# Patient Record
Sex: Female | Born: 1960 | Race: White | Hispanic: No | Marital: Married | Smoking: Current every day smoker
Health system: Southern US, Community
[De-identification: ages and names within clinical notes are randomized; demographics above are authoritative.]

## PROBLEM LIST (undated history)

## (undated) HISTORY — PX: TONSILLECTOMY: SUR1361

## (undated) HISTORY — PX: OTHER SURGICAL HISTORY: SHX169

## (undated) HISTORY — PX: CHOLECYSTECTOMY: SHX55

---

## 2007-01-18 ENCOUNTER — Ambulatory Visit: Payer: Self-pay | Admitting: Internal Medicine

## 2007-01-19 ENCOUNTER — Ambulatory Visit: Payer: Self-pay | Admitting: Internal Medicine

## 2007-05-14 ENCOUNTER — Ambulatory Visit: Payer: Self-pay | Admitting: Emergency Medicine

## 2007-05-22 ENCOUNTER — Ambulatory Visit: Payer: Self-pay | Admitting: Emergency Medicine

## 2007-12-04 ENCOUNTER — Ambulatory Visit: Payer: Self-pay | Admitting: Family Medicine

## 2008-06-28 ENCOUNTER — Ambulatory Visit: Payer: Self-pay | Admitting: Family Medicine

## 2008-08-28 IMAGING — CR DG CHEST 2V
1 series · 2 of 2 positions shown · non-contrast
Comparison: none

REASON FOR EXAM: sob
COMMENTS:

PROCEDURE:     MDR - MDR CHEST PA(OR AP) AND LATERAL  - May 22, 2007 [DATE]
RESULT:      The lung fields are clear. The heart, mediastinal and osseous
structures show no acute changes.

[Series 1: view not recorded · 0.17mm/px · 2 of 2 slices shown]
[im 1/2]
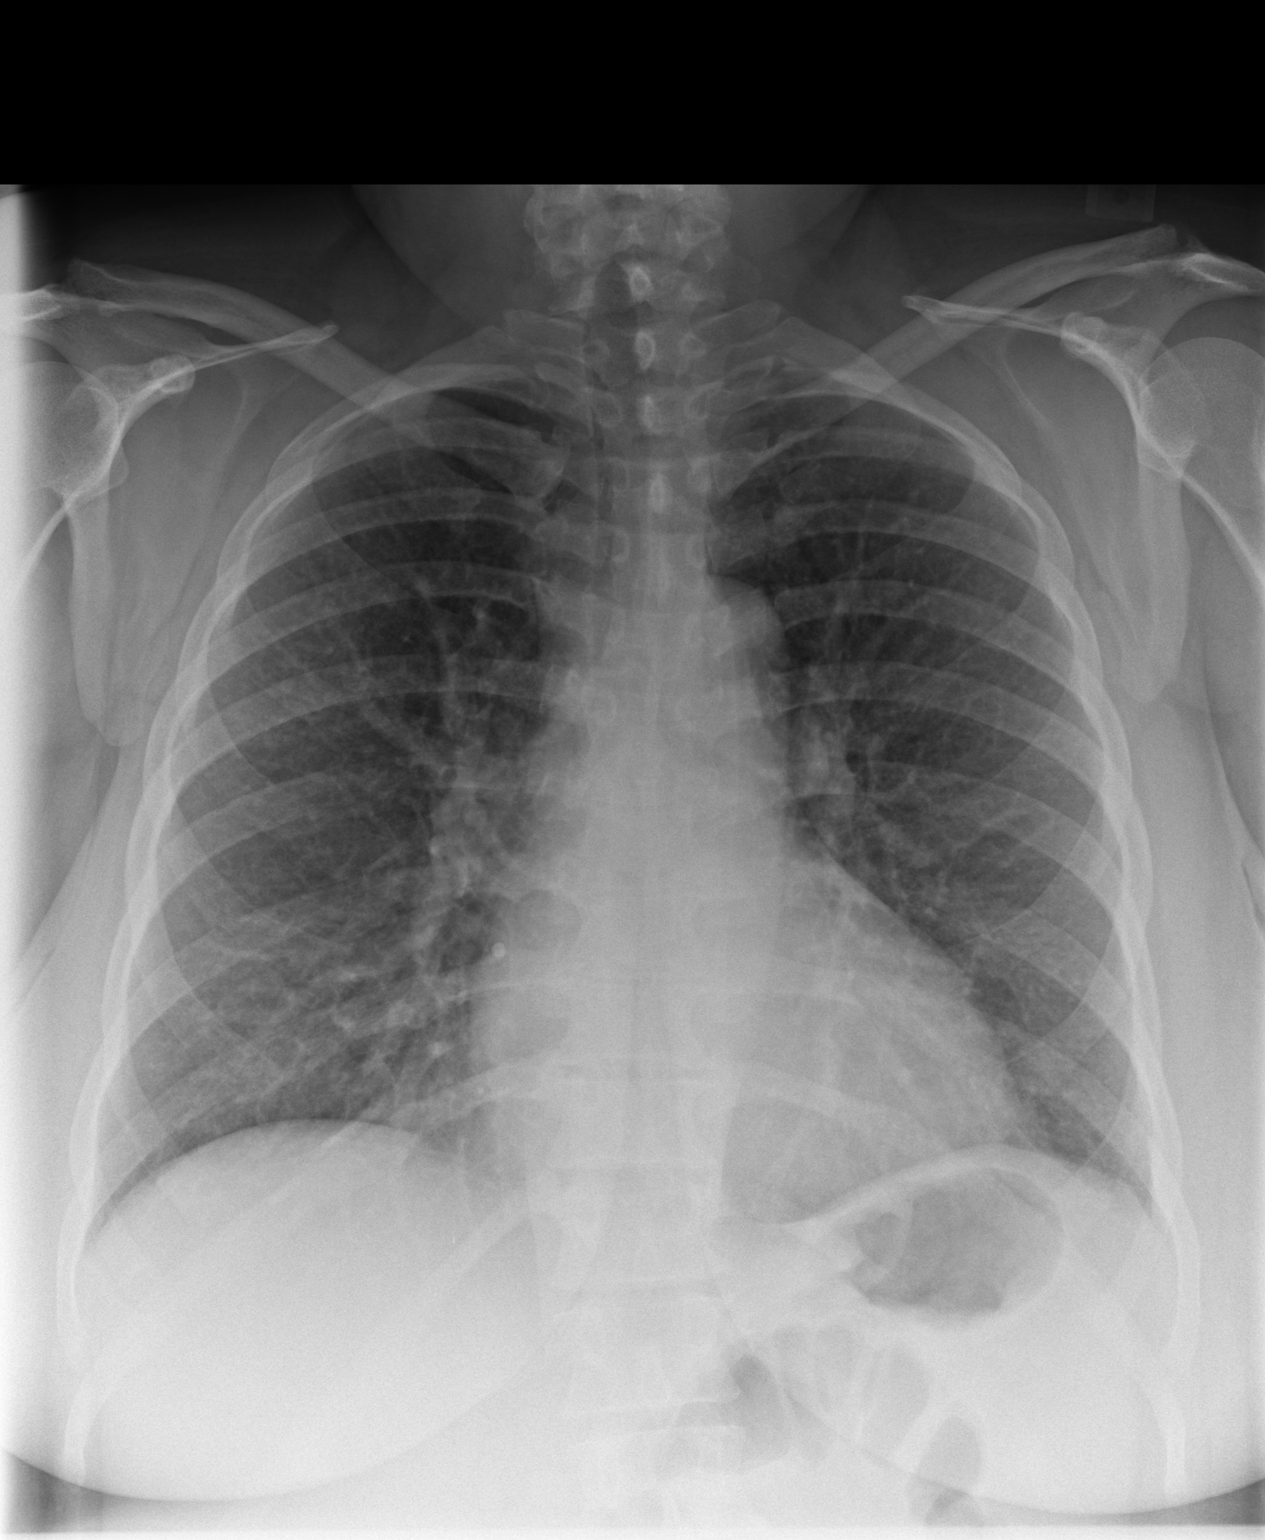
[im 2/2]
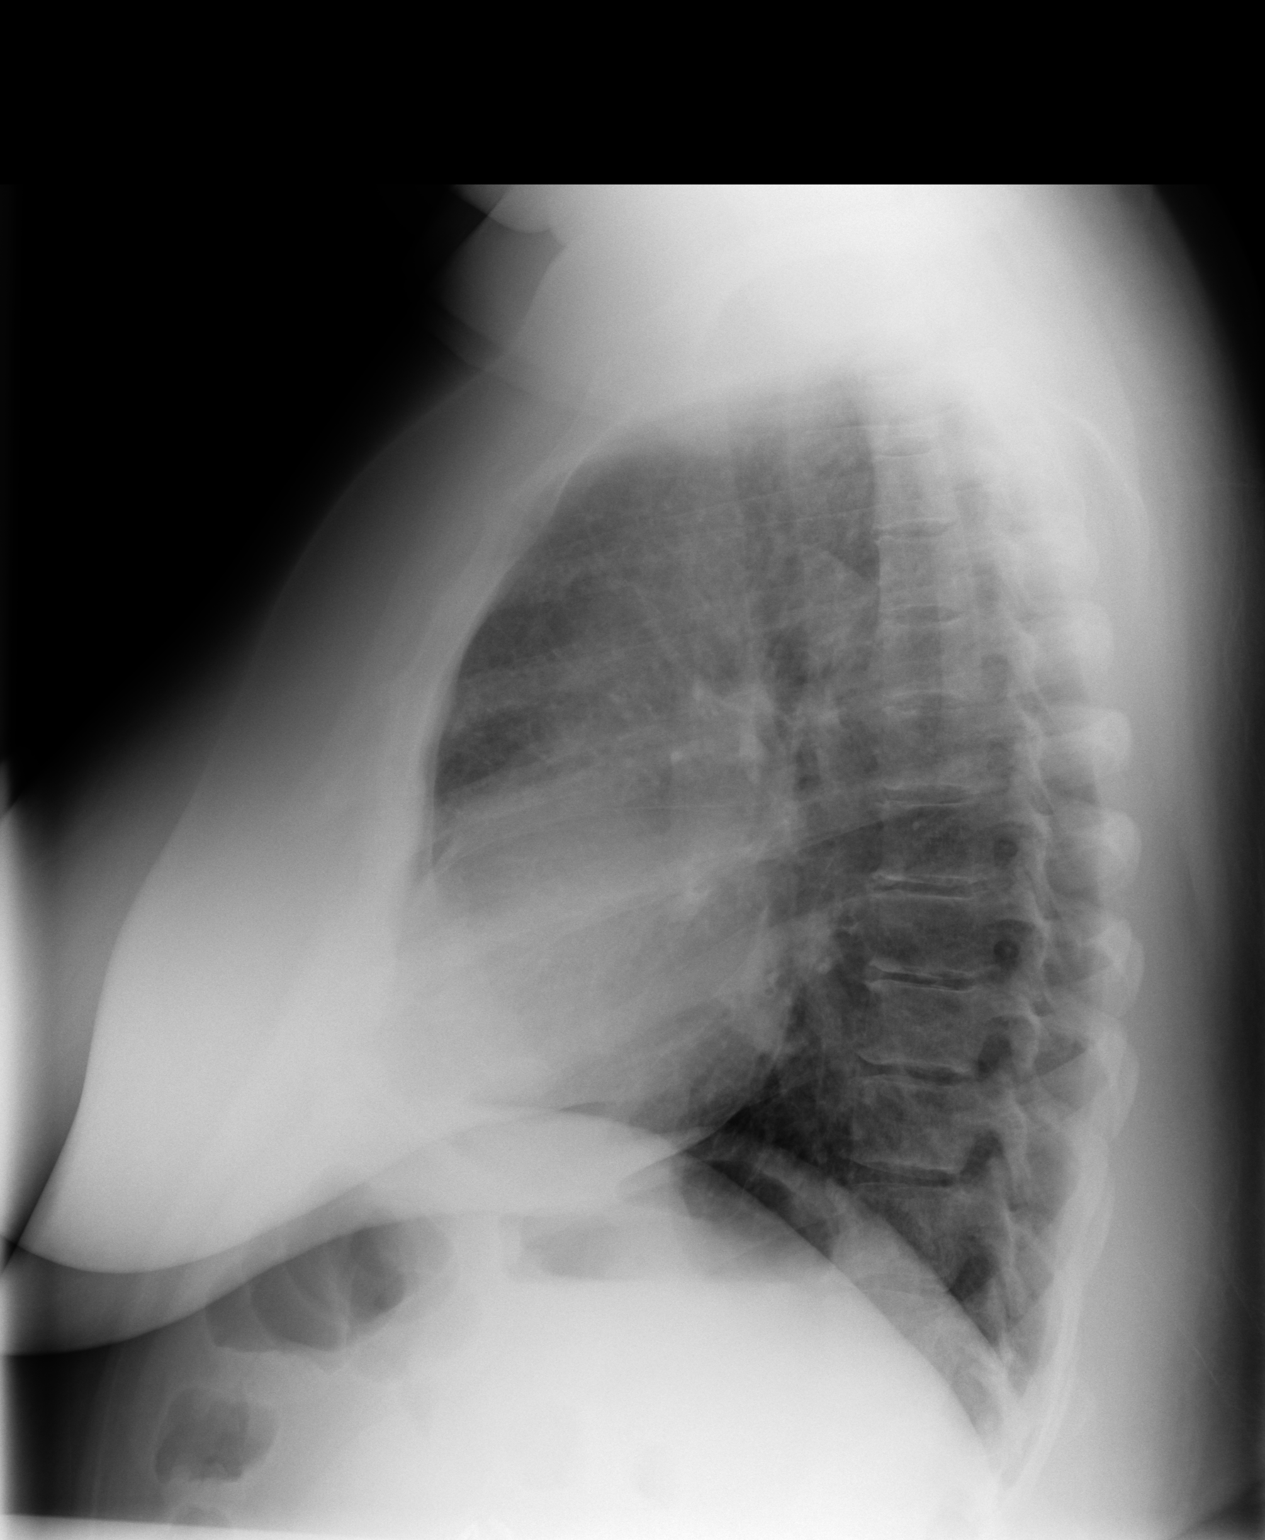

[2 of 2 positions shown; findings below may reference images not displayed]

IMPRESSION: No acute changes are identified.

## 2009-07-04 ENCOUNTER — Emergency Department: Payer: Self-pay | Admitting: Unknown Physician Specialty

## 2011-02-18 ENCOUNTER — Ambulatory Visit: Payer: Self-pay | Admitting: Internal Medicine

## 2011-08-31 ENCOUNTER — Emergency Department: Payer: Self-pay | Admitting: Emergency Medicine

## 2011-09-05 ENCOUNTER — Ambulatory Visit: Payer: Self-pay

## 2013-10-02 ENCOUNTER — Ambulatory Visit: Payer: Self-pay | Admitting: Family Medicine

## 2014-10-16 ENCOUNTER — Ambulatory Visit: Payer: Self-pay | Admitting: Family Medicine

## 2015-03-14 ENCOUNTER — Other Ambulatory Visit: Payer: Self-pay

## 2015-03-14 ENCOUNTER — Emergency Department: Payer: No Typology Code available for payment source

## 2015-03-14 ENCOUNTER — Encounter: Payer: Self-pay | Admitting: Emergency Medicine

## 2015-03-14 ENCOUNTER — Emergency Department
Admission: EM | Admit: 2015-03-14 | Discharge: 2015-03-14 | Disposition: A | Discharge status: Home / Self Care | Payer: No Typology Code available for payment source | Attending: Emergency Medicine | Admitting: Emergency Medicine

## 2015-03-14 DIAGNOSIS — Z720 Tobacco use: Secondary | ICD-10-CM | POA: Insufficient documentation

## 2015-03-14 DIAGNOSIS — Y9389 Activity, other specified: Secondary | ICD-10-CM | POA: Insufficient documentation

## 2015-03-14 DIAGNOSIS — Y9289 Other specified places as the place of occurrence of the external cause: Secondary | ICD-10-CM | POA: Insufficient documentation

## 2015-03-14 DIAGNOSIS — S8002XA Contusion of left knee, initial encounter: Secondary | ICD-10-CM | POA: Diagnosis not present

## 2015-03-14 DIAGNOSIS — Y998 Other external cause status: Secondary | ICD-10-CM | POA: Insufficient documentation

## 2015-03-14 DIAGNOSIS — W03XXXA Other fall on same level due to collision with another person, initial encounter: Secondary | ICD-10-CM | POA: Insufficient documentation

## 2015-03-14 DIAGNOSIS — S8992XA Unspecified injury of left lower leg, initial encounter: Secondary | ICD-10-CM | POA: Diagnosis present

## 2015-03-14 MED ORDER — HYDROCODONE-ACETAMINOPHEN 5-325 MG PO TABS: 1.0000 | ORAL_TABLET | ORAL | Status: AC | PRN

## 2015-03-14 MED ORDER — IBUPROFEN 800 MG PO TABS: 800.0000 mg | ORAL_TABLET | Freq: Three times a day (TID) | ORAL | Status: AC | PRN

## 2015-03-14 MED ORDER — HYDROCODONE-ACETAMINOPHEN 5-325 MG PO TABS
ORAL_TABLET | ORAL | Status: AC
  Filled 2015-03-14: qty 1

## 2015-03-14 MED ORDER — HYDROCODONE-ACETAMINOPHEN 5-325 MG PO TABS
2.0000 | ORAL_TABLET | Freq: Once | ORAL | Status: AC
  Administered 2015-03-14: 2 via ORAL

## 2015-03-14 MED ORDER — PROMETHAZINE HCL 25 MG PO TABS
25.0000 mg | ORAL_TABLET | Freq: Once | ORAL | Status: AC
  Administered 2015-03-14: 25 mg via ORAL

## 2015-03-14 MED ORDER — PROMETHAZINE HCL 25 MG PO TABS
ORAL_TABLET | ORAL | Status: AC
  Administered 2015-03-14: 25 mg via ORAL
  Filled 2015-03-14: qty 1

## 2015-03-14 MED ORDER — HYDROCODONE-ACETAMINOPHEN 5-325 MG PO TABS
ORAL_TABLET | ORAL | Status: AC
  Administered 2015-03-14: 2 via ORAL
  Filled 2015-03-14: qty 1

## 2015-03-14 NOTE — Discharge Instructions (Signed)

## 2015-03-14 NOTE — ED Notes (Signed)
Pt. Stated  That she did not want blood work taken.
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
## 2015-06-17 ENCOUNTER — Encounter: Payer: Self-pay | Admitting: Emergency Medicine

## 2015-06-17 ENCOUNTER — Ambulatory Visit
Admission: EM | Admit: 2015-06-17 | Discharge: 2015-06-17 | Disposition: A | Discharge status: Home / Self Care | Payer: Self-pay | Attending: Family Medicine | Admitting: Family Medicine

## 2015-06-17 DIAGNOSIS — J0100 Acute maxillary sinusitis, unspecified: Secondary | ICD-10-CM

## 2015-06-17 MED ORDER — AMOXICILLIN 875 MG PO TABS: 875.0000 mg | ORAL_TABLET | Freq: Two times a day (BID) | ORAL | Status: AC

## 2015-06-17 MED ORDER — HYDROCOD POLST-CPM POLST ER 10-8 MG/5ML PO SUER: 5.0000 mL | Freq: Two times a day (BID) | ORAL | Status: AC

## 2015-06-17 NOTE — ED Provider Notes (Signed)
CSN: [id number]     Arrival date & time 06/17/15  4098 History   First MD Initiated Contact with Patient 06/17/15 1031     Chief Complaint  Patient presents with  . Facial Pain  . Headache  . Cough   (Consider location/radiation/quality/duration/timing/severity/associated sxs/prior Treatment) Patient is a 54 y.o. female presenting with URI. The history is provided by the patient.  URI Presenting symptoms: congestion, cough, facial pain and fatigue   Severity:  Moderate Onset quality:  Gradual Timing:  Constant Progression:  Worsening Chronicity:  New Associated symptoms: headaches and sinus pain   Associated symptoms: no arthralgias, no myalgias and no wheezing     History reviewed. No pertinent past medical history. Past Surgical History  Procedure Laterality Date  . Tonsillectomy    . Cholecystectomy    . Arm surgery Right    History reviewed. No pertinent family history. Social History  Substance Use Topics  . Smoking status: Current Every Day Smoker -- 0.50 packs/day    Types: Cigarettes  . Smokeless tobacco: Never Used  . Alcohol Use: No   OB History    No data available     Review of Systems  Constitutional: Positive for fatigue.  HENT: Positive for congestion.   Respiratory: Positive for cough. Negative for wheezing.   Musculoskeletal: Negative for myalgias and arthralgias.  Neurological: Positive for headaches.    Allergies  Review of patient's allergies indicates no known allergies.  Home Medications   Prior to Admission medications   Medication Sig Start Date End Date Taking? Authorizing Provider  amoxicillin (AMOXIL) 875 MG tablet Take 1 tablet (875 mg total) by mouth 2 (two) times daily. 06/17/15   Payton Mccallum, MD  chlorpheniramine-HYDROcodone (TUSSIONEX PENNKINETIC ER) 10-8 MG/5ML SUER Take 5 mLs by mouth 2 (two) times daily. 06/17/15   Lutricia Feil, PA-C  HYDROcodone-acetaminophen (NORCO) 5-325 MG per tablet Take 1-2 tablets by mouth  every 4 (four) hours as needed for moderate pain. 03/14/15   Charmayne Sheer Beers, PA-C  ibuprofen (ADVIL,MOTRIN) 800 MG tablet Take 1 tablet (800 mg total) by mouth every 8 (eight) hours as needed. 03/14/15   Evangeline Dakin, PA-C   Meds Ordered and Administered this Visit  Medications - No data to display  BP 160/82 mmHg  Pulse 86  Temp(Src) 96.9 F (36.1 C) (Tympanic)  Resp 17  Ht 5' (1.524 m)  Wt 225 lb (102.059 kg)  BMI 43.94 kg/m2  SpO2 96% No data found.   Physical Exam  Constitutional: She appears well-developed and well-nourished. No distress.  HENT:  Head: Normocephalic and atraumatic.  Right Ear: Tympanic membrane, external ear and ear canal normal.  Left Ear: Tympanic membrane, external ear and ear canal normal.  Nose: Mucosal edema and rhinorrhea present. No nose lacerations, sinus tenderness, nasal deformity, septal deviation or nasal septal hematoma. No epistaxis.  No foreign bodies. Right sinus exhibits maxillary sinus tenderness and frontal sinus tenderness. Left sinus exhibits maxillary sinus tenderness and frontal sinus tenderness.  Mouth/Throat: Uvula is midline, oropharynx is clear and moist and mucous membranes are normal. No oropharyngeal exudate.  Eyes: Conjunctivae and EOM are normal. Pupils are equal, round, and reactive to light. Right eye exhibits no discharge. Left eye exhibits no discharge. No scleral icterus.  Neck: Normal range of motion. Neck supple. No thyromegaly present.  Cardiovascular: Normal rate, regular rhythm and normal heart sounds.   Pulmonary/Chest: Effort normal and breath sounds normal. No respiratory distress. She has no wheezes. She has no rales.  Lymphadenopathy:    She has no cervical adenopathy.  Skin: No rash noted. She is not diaphoretic.  Nursing note and vitals reviewed.   ED Course  Procedures (including critical care time)  Labs Review Labs Reviewed - No data to display  Imaging Review No results found.   Visual Acuity  Review  Right Eye Distance:   Left Eye Distance:   Bilateral Distance:    Right Eye Near:   Left Eye Near:    Bilateral Near:         MDM   1. Acute maxillary sinusitis, recurrence not specified    Discharge Medication List as of 06/17/2015 10:56 AM    START taking these medications   Details  amoxicillin (AMOXIL) 875 MG tablet Take 1 tablet (875 mg total) by mouth 2 (two) times daily., Starting 06/17/2015, Until Discontinued, Normal    chlorpheniramine-HYDROcodone (TUSSIONEX PENNKINETIC ER) 10-8 MG/5ML SUER Take 5 mLs by mouth 2 (two) times daily., Starting 06/17/2015, Until Discontinued, Print      1. diagnosis reviewed with patient/parent/guardian/family 2. rx as per orders above; reviewed possible side effects, interactions, risks and benefits  3. Recommend supportive treatment with otc flonase, analgesics prn 4. Recommend smoking cessation 5. Follow prn if symptoms worsen or don't improve    Payton Mccallum, MD 06/17/15 1100

## 2015-06-17 NOTE — ED Notes (Signed)
Patient c/o sinus pain and pressure, HAs, and cough for a week.
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
## 2018-10-07 DEATH — deceased
